# Patient Record
Sex: Female | Born: 1975 | Race: Black or African American | Hispanic: No | Marital: Single | Smoking: Never smoker
Health system: Southern US, Community
[De-identification: ages and names within clinical notes are randomized; demographics above are authoritative.]

## PROBLEM LIST (undated history)

## (undated) DIAGNOSIS — J45909 Unspecified asthma, uncomplicated: Secondary | ICD-10-CM

---

## 2018-04-08 ENCOUNTER — Encounter (HOSPITAL_BASED_OUTPATIENT_CLINIC_OR_DEPARTMENT_OTHER): Payer: Self-pay | Admitting: Emergency Medicine

## 2018-04-08 ENCOUNTER — Emergency Department (HOSPITAL_BASED_OUTPATIENT_CLINIC_OR_DEPARTMENT_OTHER)
Admission: EM | Admit: 2018-04-08 | Discharge: 2018-04-08 | Disposition: A | Discharge status: Home / Self Care | Payer: Self-pay | Attending: Emergency Medicine | Admitting: Emergency Medicine

## 2018-04-08 ENCOUNTER — Other Ambulatory Visit: Payer: Self-pay

## 2018-04-08 DIAGNOSIS — R0789 Other chest pain: Secondary | ICD-10-CM | POA: Insufficient documentation

## 2018-04-08 DIAGNOSIS — J45909 Unspecified asthma, uncomplicated: Secondary | ICD-10-CM | POA: Insufficient documentation

## 2018-04-08 DIAGNOSIS — Z79899 Other long term (current) drug therapy: Secondary | ICD-10-CM | POA: Insufficient documentation

## 2018-04-08 HISTORY — DX: Unspecified asthma, uncomplicated: J45.909

## 2018-04-08 MED ORDER — SULFAMETHOXAZOLE-TRIMETHOPRIM 800-160 MG PO TABS: 1.0000 | ORAL_TABLET | Freq: Two times a day (BID) | ORAL | 0 refills | Status: AC

## 2018-04-08 NOTE — ED Provider Notes (Signed)
[facility] EMERGENCY DEPARTMENT Provider Note   CSN: [id number] Arrival date & time: 04/08/18  0920     History   Chief Complaint Chief Complaint  Patient presents with  . Abscess    HPI Denise Dennis is a 43 y.o. female.  HPI   43 year old female with chest pain.  She states that she noticed "a knot" in the center of her chest yesterday.  This been painful since she noticed it.  Hurts to the touch and also had increased pain when she is lying her back trying to sleep.  No overlying skin changes.  No fevers or chills. No drainage.  No cough or shortness of breath.  Denies any trauma to the area.  Past Medical History:  Diagnosis Date  . Asthma     There are no active problems to display for this patient.   History reviewed. No pertinent surgical history.   OB History   No obstetric history on file.      Home Medications    Prior to Admission medications   Medication Sig Start Date End Date Taking? Authorizing Provider  albuterol (PROVENTIL HFA;VENTOLIN HFA) 108 (90 Base) MCG/ACT inhaler Inhale 2 puffs into the lungs every 6 (six) hours as needed for wheezing or shortness of breath.   Yes [provider]    Family History History reviewed. No pertinent family history.  Social History Social History   Tobacco Use  . Smoking status: Never Smoker  . Smokeless tobacco: Never Used  Substance Use Topics  . Alcohol use: Never    Frequency: Never  . Drug use: Never     Allergies   Patient has no known allergies.   Review of Systems Review of Systems  All systems reviewed and negative, other than as noted in HPI.  Physical Exam Updated Vital Signs Pulse (!) 51   Resp 18   Ht 5\' 4"  (1.626 m)   Wt 93 kg   LMP 03/31/2018   SpO2 100%   BMI 35.19 kg/m   Physical Exam Vitals signs and nursing note reviewed.  Constitutional:      General: She is not in acute distress.    Appearance: She is well-developed.  HENT:     Head:  Normocephalic and atraumatic.  Eyes:     General:        Right eye: No discharge.        Left eye: No discharge.     Conjunctiva/sclera: Conjunctivae normal.  Neck:     Musculoskeletal: Neck supple.  Cardiovascular:     Rate and Rhythm: Normal rate and regular rhythm.     Heart sounds: Normal heart sounds. No murmur. No friction rub. No gallop.      Comments: Midline near mid sternum there tender area about the size of a nickel. I question wether I can actually feel a small subcutaneous mass or indurated area. Overlying skin is slightly darker pigment. No erythema. No fluctuance.  Pulmonary:     Effort: Pulmonary effort is normal. No respiratory distress.     Breath sounds: Normal breath sounds.  Chest:    Abdominal:     General: There is no distension.     Palpations: Abdomen is soft.     Tenderness: There is no abdominal tenderness.  Musculoskeletal:        General: No tenderness.  Skin:    General: Skin is warm and dry.  Neurological:     Mental Status: She is alert.  Psychiatric:  Behavior: Behavior normal.        Thought Content: Thought content normal.      ED Treatments / Results  Labs (all labs ordered are listed, but only abnormal results are displayed) Labs Reviewed - No data to display  EKG EKG Interpretation  Date/Time:  Sunday April 08 2018 09:28:58 EST Ventricular Rate:  95 PR Interval:  116 QRS Duration: 84 QT Interval:  374 QTC Calculation: 469 R Axis:   82 Text Interpretation:  Sinus rhythm with frequent Premature ventricular complexes in a pattern of bigeminy Otherwise normal ECG Confirmed by Raeford RazorKohut, Lilyana Lippman [phone]([id number]) on 04/08/2018 9:35:18 AM   Radiology No results found.  Procedures Procedures (including critical care time)  Medications Ordered in ED Medications - No data to display   Initial Impression / Assessment and Plan / ED Course  I have reviewed the triage vital signs and the nursing notes.  Pertinent labs & imaging  results that were available during my care of the patient were reviewed by me and considered in my medical decision making (see chart for details).    42yF with painful spot on her chest wall. Developing a small abscess?  She says she feels a "knot." Her exam is not very impressive. Maybe I can feel a small nodule? Will treat as possible abscess. Nothing I would attempt to I&D at this point. This seems distinct from sternum and also breast tissue but I advised her that it would be prudent to also obtain mammography.   Final Clinical Impressions(s) / ED Diagnoses   Final diagnoses:  Anterior chest wall pain    ED Discharge Orders    None       Raeford RazorKohut, Cayle Thunder, MD 04/08/18 [phone]

## 2018-04-08 NOTE — Discharge Instructions (Addendum)
Take antibiotics as prescribed. Take 600 mg of ibuprofen every 6 hours as needed for pain. I think you may be starting to develop an abscess/boil but I would also obtain a mammogram to further assess.

## 2018-04-08 NOTE — ED Triage Notes (Signed)
Patient states that she has a pimple on her chest that is causing her pain  - the patient reports that she noticed that bump yesterday

## 2018-07-28 ENCOUNTER — Encounter (HOSPITAL_BASED_OUTPATIENT_CLINIC_OR_DEPARTMENT_OTHER): Payer: Self-pay | Admitting: Emergency Medicine

## 2018-07-28 ENCOUNTER — Emergency Department (HOSPITAL_BASED_OUTPATIENT_CLINIC_OR_DEPARTMENT_OTHER): Payer: Self-pay

## 2018-07-28 ENCOUNTER — Emergency Department (HOSPITAL_BASED_OUTPATIENT_CLINIC_OR_DEPARTMENT_OTHER)
Admission: EM | Admit: 2018-07-28 | Discharge: 2018-07-28 | Disposition: A | Discharge status: Home / Self Care | Payer: Self-pay | Attending: Emergency Medicine | Admitting: Emergency Medicine

## 2018-07-28 ENCOUNTER — Other Ambulatory Visit: Payer: Self-pay

## 2018-07-28 DIAGNOSIS — R079 Chest pain, unspecified: Secondary | ICD-10-CM | POA: Insufficient documentation

## 2018-07-28 DIAGNOSIS — M436 Torticollis: Secondary | ICD-10-CM | POA: Insufficient documentation

## 2018-07-28 DIAGNOSIS — J45909 Unspecified asthma, uncomplicated: Secondary | ICD-10-CM | POA: Insufficient documentation

## 2018-07-28 DIAGNOSIS — M7918 Myalgia, other site: Secondary | ICD-10-CM | POA: Insufficient documentation

## 2018-07-28 DIAGNOSIS — R0789 Other chest pain: Secondary | ICD-10-CM

## 2018-07-28 LAB — BASIC METABOLIC PANEL
Anion gap: 8 (ref 5–15)
BUN: 13 mg/dL (ref 6–20)
CO2: 21 mmol/L — ABNORMAL LOW (ref 22–32)
Calcium: 8.8 mg/dL — ABNORMAL LOW (ref 8.9–10.3)
Chloride: 109 mmol/L (ref 98–111)
Creatinine, Ser: 0.8 mg/dL (ref 0.44–1.00)
GFR calc Af Amer: >60 mL/min (ref >60)
GFR calc non Af Amer: >60 mL/min (ref >60)
Glucose, Bld: 95 mg/dL (ref 70–99)
Potassium: 3.7 mmol/L (ref 3.5–5.1)
Sodium: 138 mmol/L (ref 135–145)

## 2018-07-28 LAB — TROPONIN I
Troponin I: <0.03 ng/mL (ref <0.03)
Troponin I: <0.03 ng/mL (ref <0.03)

## 2018-07-28 LAB — CBC
HCT: 36 % (ref 36.0–46.0)
Hemoglobin: 10.8 g/dL — ABNORMAL LOW (ref 12.0–15.0)
MCH: 24.5 pg — ABNORMAL LOW (ref 26.0–34.0)
MCHC: 30 g/dL (ref 30.0–36.0)
MCV: 81.6 fL (ref 80.0–100.0)
Platelets: 226 10*3/uL (ref 150–400)
RBC: 4.41 MIL/uL (ref 3.87–5.11)
RDW: 16.5 % — ABNORMAL HIGH (ref 11.5–15.5)
WBC: 9.9 10*3/uL (ref 4.0–10.5)
nRBC: 0 % (ref 0.0–0.2)

## 2018-07-28 LAB — PREGNANCY, URINE: Preg Test, Ur: NEGATIVE

## 2018-07-28 MED ORDER — METHOCARBAMOL 500 MG PO TABS: 500.0000 mg | ORAL_TABLET | Freq: Two times a day (BID) | ORAL | 0 refills | Status: DC

## 2018-07-28 MED ORDER — KETOROLAC TROMETHAMINE 30 MG/ML IJ SOLN
30.0000 mg | Freq: Once | INTRAMUSCULAR | Status: AC
  Administered 2018-07-28: 30 mg via INTRAVENOUS
  Filled 2018-07-28: qty 1

## 2018-07-28 NOTE — ED Provider Notes (Signed)
[facility] EMERGENCY DEPARTMENT Provider Note   CSN: [id number] Arrival date & time: 07/28/18  1631    History   Chief Complaint Chief Complaint  Patient presents with   Chest Pain    HPI Denise Dennis is a 43 y.o. female with PMH/o asthma who presents for evaluation of 2 days of pain noted to her left neck, shoulder and chest.  Patient reports that initially pain began 2 days ago when she woke up in the morning.  She states initially, it was towards the trapezius area and was extending up towards the paraspinal muscles of her left cervical region.  Patient states she is able to tolerate her secretions and p.o. without any difficulty.  She does report that it feels sore. She states the pain is worse with movement. Patient reports that over the course today, it started spreading towards the neck into the anterior aspect of the chest and the shoulder.  She describes it as a "constant soreness" that has been ongoing for the last 2 days.  Patient reports she took Tylenol PM to sleep which helped her to sleep but states she continued to have pain.  Patient states that she does not have any associated nausea, diaphoresis.  Patient states that the pain is not worse with deep inspiration or exertion.  Patient states that the pain is worse when she lays down and tries to lay on her left side.  She states it is slightly better if she sits up.  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  Patient states she lifted a patient a few days prior to onset of symptoms.  But denies any other trauma, injury, fall.  Patient states that she has not had any preceding fever, cough, congestion.  She states she has not had any associated shortness of breath.  Patient states that she does not smoke and denies any cocaine or IV drug use.  She denies any personal cardiac history.  She denies any family heart attacks over the age of 66.  Patient with no history of high  blood pressure or diabetes.  Patient denies any fevers, vision changes, dizziness, numbness/weakness of her arms or legs, difficulty ambulating. She denies any OCP use, recent immobilization, prior history of DVT/PE, recent surgery, leg swelling, or long travel. Denies fevers, weight loss, numbness/weakness of upper and lower extremities, bowel/bladder incontinence, saddle anesthesia, history of back surgery, history of IVDA.      The history is provided by the patient.    Past Medical History:  Diagnosis Date   Asthma     There are no active problems to display for this patient.   History reviewed. No pertinent surgical history.   OB History   No obstetric history on file.      Home Medications    Prior to Admission medications   Medication Sig Start Date End Date Taking? Authorizing Provider  albuterol (PROVENTIL HFA;VENTOLIN HFA) 108 (90 Base) MCG/ACT inhaler Inhale 2 puffs into the lungs every 6 (six) hours as needed for wheezing or shortness of breath.    [provider]  methocarbamol (ROBAXIN) 500 MG tablet Take 1 tablet (500 mg total) by mouth 2 (two) times daily. 07/28/18   Maxwell Caul, PA-C    Family History No family history on file.  Social History Social History   Tobacco Use   Smoking status: Never Smoker   Smokeless tobacco: Never Used  Substance Use Topics  Alcohol use: Never    Frequency: Never   Drug use: Never     Allergies   Patient has no known allergies.   Review of Systems Review of Systems  Constitutional: Negative for fever.  Eyes: Negative for visual disturbance.  Respiratory: Negative for cough and shortness of breath.   Cardiovascular: Positive for chest pain.  Gastrointestinal: Negative for abdominal pain, nausea and vomiting.  Genitourinary: Negative for dysuria and hematuria.  Musculoskeletal: Positive for neck pain.       Shoulder pain  Neurological: Negative for dizziness, weakness, numbness and  headaches.  All other systems reviewed and are negative.    Physical Exam Updated Vital Signs BP (!) 145/93    Pulse 95    Temp 98.2 F (36.8 C) (Oral)    Resp (!) 21    Ht 5\' 4"  (1.626 m)    Wt 94.3 kg    LMP 07/25/2018    SpO2 100%    BMI 35.70 kg/m   Physical Exam Vitals signs and nursing note reviewed.  Constitutional:      Appearance: Normal appearance. She is well-developed.  HENT:     Head: Normocephalic and atraumatic.  Eyes:     General: Lids are normal.     Conjunctiva/sclera: Conjunctivae normal.     Pupils: Pupils are equal, round, and reactive to light.     Comments: PERRL. EOMs intact.   Neck:     Musculoskeletal: Normal range of motion and neck supple.     Thyroid: No thyroid mass or thyromegaly.     Vascular: No carotid bruit.      Comments: Diffuse tenderness palpation noted to the paraspinal muscles of the left cervical region that extends into the musculoskeletal of the left shoulder.  Flexion/tension intact without any difficulty.  She does report some pain with flexion.  She reports some soreness with lateral movement but is intact with any difficulty.  Neck is supple without rigidity.  With palpation of thyroid.  No evidence of thyromegaly, mass. Cardiovascular:     Rate and Rhythm: Normal rate and regular rhythm.     Pulses: Normal pulses.          Radial pulses are 2+ on the right side and 2+ on the left side.       Dorsalis pedis pulses are 2+ on the right side and 2+ on the left side.     Heart sounds: Normal heart sounds. No murmur. No friction rub. No gallop.   Pulmonary:     Effort: Pulmonary effort is normal.     Breath sounds: Normal breath sounds.  Chest:       Comments: Pain reproduced with palpation of the midsternal chest area.  No deformity or crepitus noted. Abdominal:     Palpations: Abdomen is soft. Abdomen is not rigid.     Tenderness: There is no abdominal tenderness. There is no guarding.  Musculoskeletal: Normal range of motion.    Skin:    General: Skin is warm and dry.     Capillary Refill: Capillary refill takes less than 2 seconds.  Neurological:     Mental Status: She is alert and oriented to person, place, and time.     Comments: Cranial nerves III-XII intact Follows commands, Moves all extremities  5/5 strength to BUE and BLE  Sensation intact throughout all major nerve distributions Normal coordination No pronator drift. No gait abnormalities  No slurred speech. No facial droop.   Psychiatric:  Speech: Speech normal.      ED Treatments / Results  Labs (all labs ordered are listed, but only abnormal results are displayed) Labs Reviewed  BASIC METABOLIC PANEL - Abnormal; Notable for the following components:      Result Value   CO2 21 (*)    Calcium 8.8 (*)    All other components within normal limits  CBC - Abnormal; Notable for the following components:   Hemoglobin 10.8 (*)    MCH 24.5 (*)    RDW 16.5 (*)    All other components within normal limits  TROPONIN I  PREGNANCY, URINE  TROPONIN I    EKG EKG Interpretation  Date/Time:  Saturday Jul 28 2018 16:37:04 EDT Ventricular Rate:  94 PR Interval:  126 QRS Duration: 84 QT Interval:  372 QTC Calculation: 465 R Axis:   77 Text Interpretation:  Sinus rhythm with Premature supraventricular complexes and with frequent Premature ventricular complexes Otherwise normal ECG No significant change since last tracing Confirmed by Tilden Fossa [phone]) on 07/28/2018 4:40:00 PM   Radiology Dg Chest 2 View  Result Date: 07/28/2018 CLINICAL DATA:  Pain between shoulder blades.  Palpitations. EXAM: CHEST - 2 VIEW COMPARISON:  None. FINDINGS: The heart size and mediastinal contours are within normal limits. Both lungs are clear. The visualized skeletal structures are unremarkable. IMPRESSION: No active cardiopulmonary disease. Electronically Signed   By: Gerome Sam III M.D   On: 07/28/2018 17:14    Procedures Procedures (including  critical care time)  Medications Ordered in ED Medications  ketorolac (TORADOL) 30 MG/ML injection 30 mg (30 mg Intravenous Given 07/28/18 2049)     Initial Impression / Assessment and Plan / ED Course  I have reviewed the triage vital signs and the nursing notes.  Pertinent labs & imaging results that were available during my care of the patient were reviewed by me and considered in my medical decision making (see chart for details).        43 year old female who presents for evaluation of pain to neck, shoulder, chest x2 days.  Reports it started when she woke up 2 days ago.  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  No associated numbness/weakness, difficulty breathing, fevers, vision changes, dizziness.  She describes it as a soreness.  It is not worse with deep inspiration or exertion. Patient is afebrile, non-toxic appearing, sitting comfortably on examination table. Vital signs reviewed and stable. No neuro deficits noted on exam.  On exam, she has tenderness palpation noted of the trapezius muscle and the paraspinal muscles of the right cervical region.  Suspect that this may be torticollis, particular given that her pain is worse with movement.  Low suspicion for ACS etiology as it sounds atypical.  Additionally, history/physical exam is not concerning for PE, cauda equina, spinal abscess, carotid artery dissection, aortic dissection, CVA.  Initial labs ordered at triage.  Troponin negative.  CBC without any significant leukocytosis.  Hemoglobin is 10.8.  BMP is unremarkable.  Chest x-ray negative for any acute infectious etiology.  EKG reassuring.  Delta troponin negative.  Vitals stable.  Discussed results with patient.  Patient reports some improvement in pain with analgesics here in the department.  At this time, I suspect that her symptoms most likely related to torticollis with musculoskeletal pain.  Encourage at home supportive care  measures.  We will plan to give short course of muscle relaxers to help with pain. At this time, patient  exhibits no emergent life-threatening condition that require further evaluation in ED or admission. Patient had ample opportunity for questions and discussion. All patient's questions were answered with full understanding. Strict return precautions discussed. Patient expresses understanding and agreement to plan.   Portions of this note were generated with Scientist, clinical (histocompatibility and immunogenetics). Dictation errors may occur despite best attempts at proofreading.   Final Clinical Impressions(s) / ED Diagnoses   Final diagnoses:  Torticollis  Musculoskeletal pain  Atypical chest pain    ED Discharge Orders         Ordered    methocarbamol (ROBAXIN) 500 MG tablet  2 times daily     07/28/18 2146           Rosana Hoes 07/28/18 2315    Tilden Fossa, MD 07/31/18 0700

## 2018-07-28 NOTE — Discharge Instructions (Signed)
You can take Tylenol or Ibuprofen as directed for pain. You can alternate Tylenol and Ibuprofen every 4 hours. If you take Tylenol at 1pm, then you can take Ibuprofen at 5pm. Then you can take Tylenol again at 9pm.   Take Robaxin as prescribed. This medication will make you drowsy so do not drive or drink alcohol when taking it.  You can apply heat to help with pain.  Return to the emergency department for worsening pain, fever, numbness/weakness of your arms or legs, worsening chest pain, difficulty breathing or any other worsening or concerning symptoms.

## 2018-07-28 NOTE — ED Triage Notes (Signed)
L side chest pain radiating into jaw and L shoulder x 2 days.

## 2020-06-16 ENCOUNTER — Telehealth: Payer: Self-pay

## 2020-06-16 NOTE — Telephone Encounter (Signed)
Appointment for 06/18/2020 has been cancelled.  Dr. Sedalia Muta had an emergency and will be out of the office for a few weeks or so. Dr. Sedalia Muta stated that can reschedule the appointment with a different provider ONLY if the pt does not have Medicare ins. If this patient has Medicare she will need to see Dr. Marina Goodell.  I left a message on the number listed in the cart asking the patient to call back to rescheduled the appointment.

## 2020-06-18 ENCOUNTER — Ambulatory Visit: Payer: Self-pay | Admitting: Family Medicine

## 2020-10-05 IMAGING — DX CHEST - 2 VIEW
2 series · 2 of 2 positions shown · non-contrast
Comparison: None.

CLINICAL DATA: Pain between shoulder blades.  Palpitations.

EXAM:
CHEST - 2 VIEW

[chest pa]
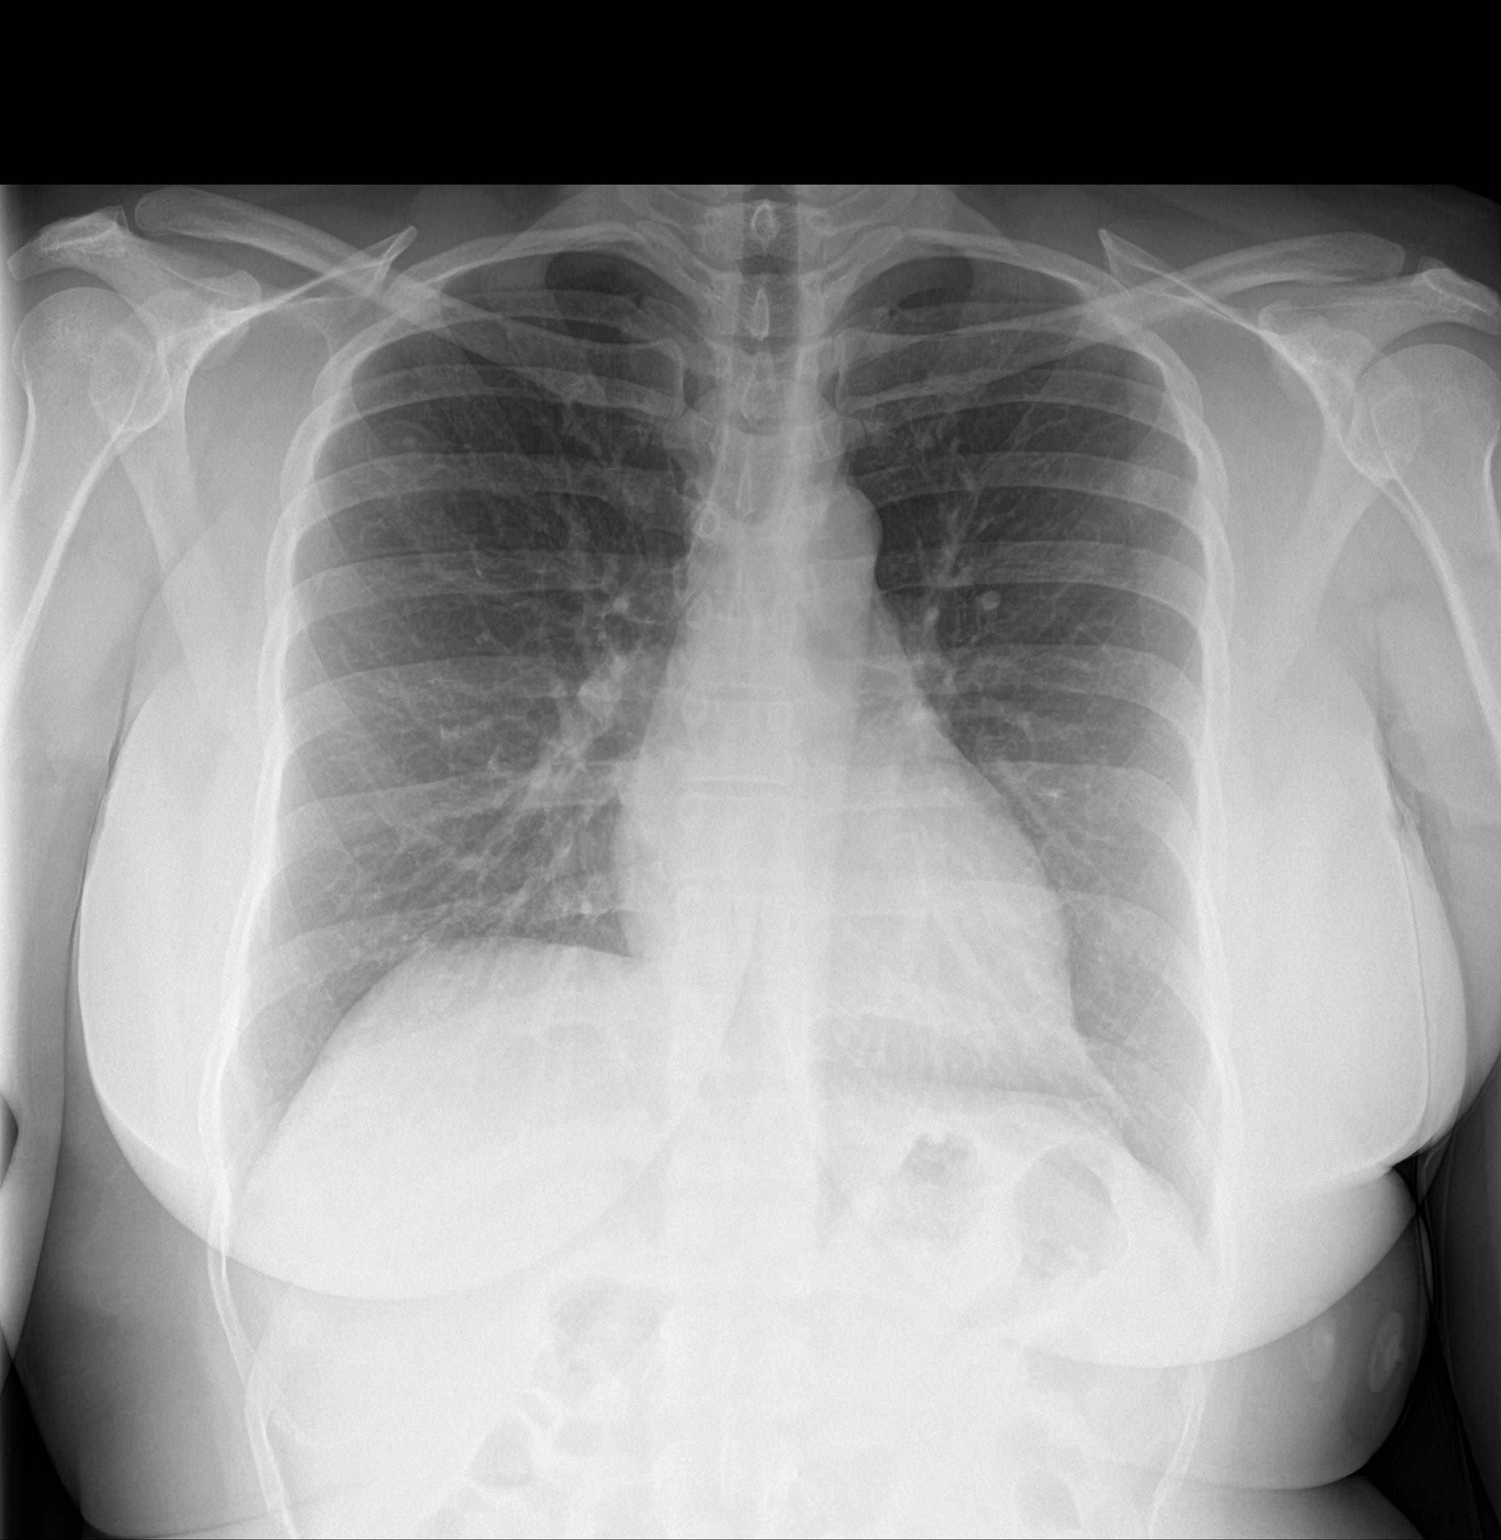

[chest lat]
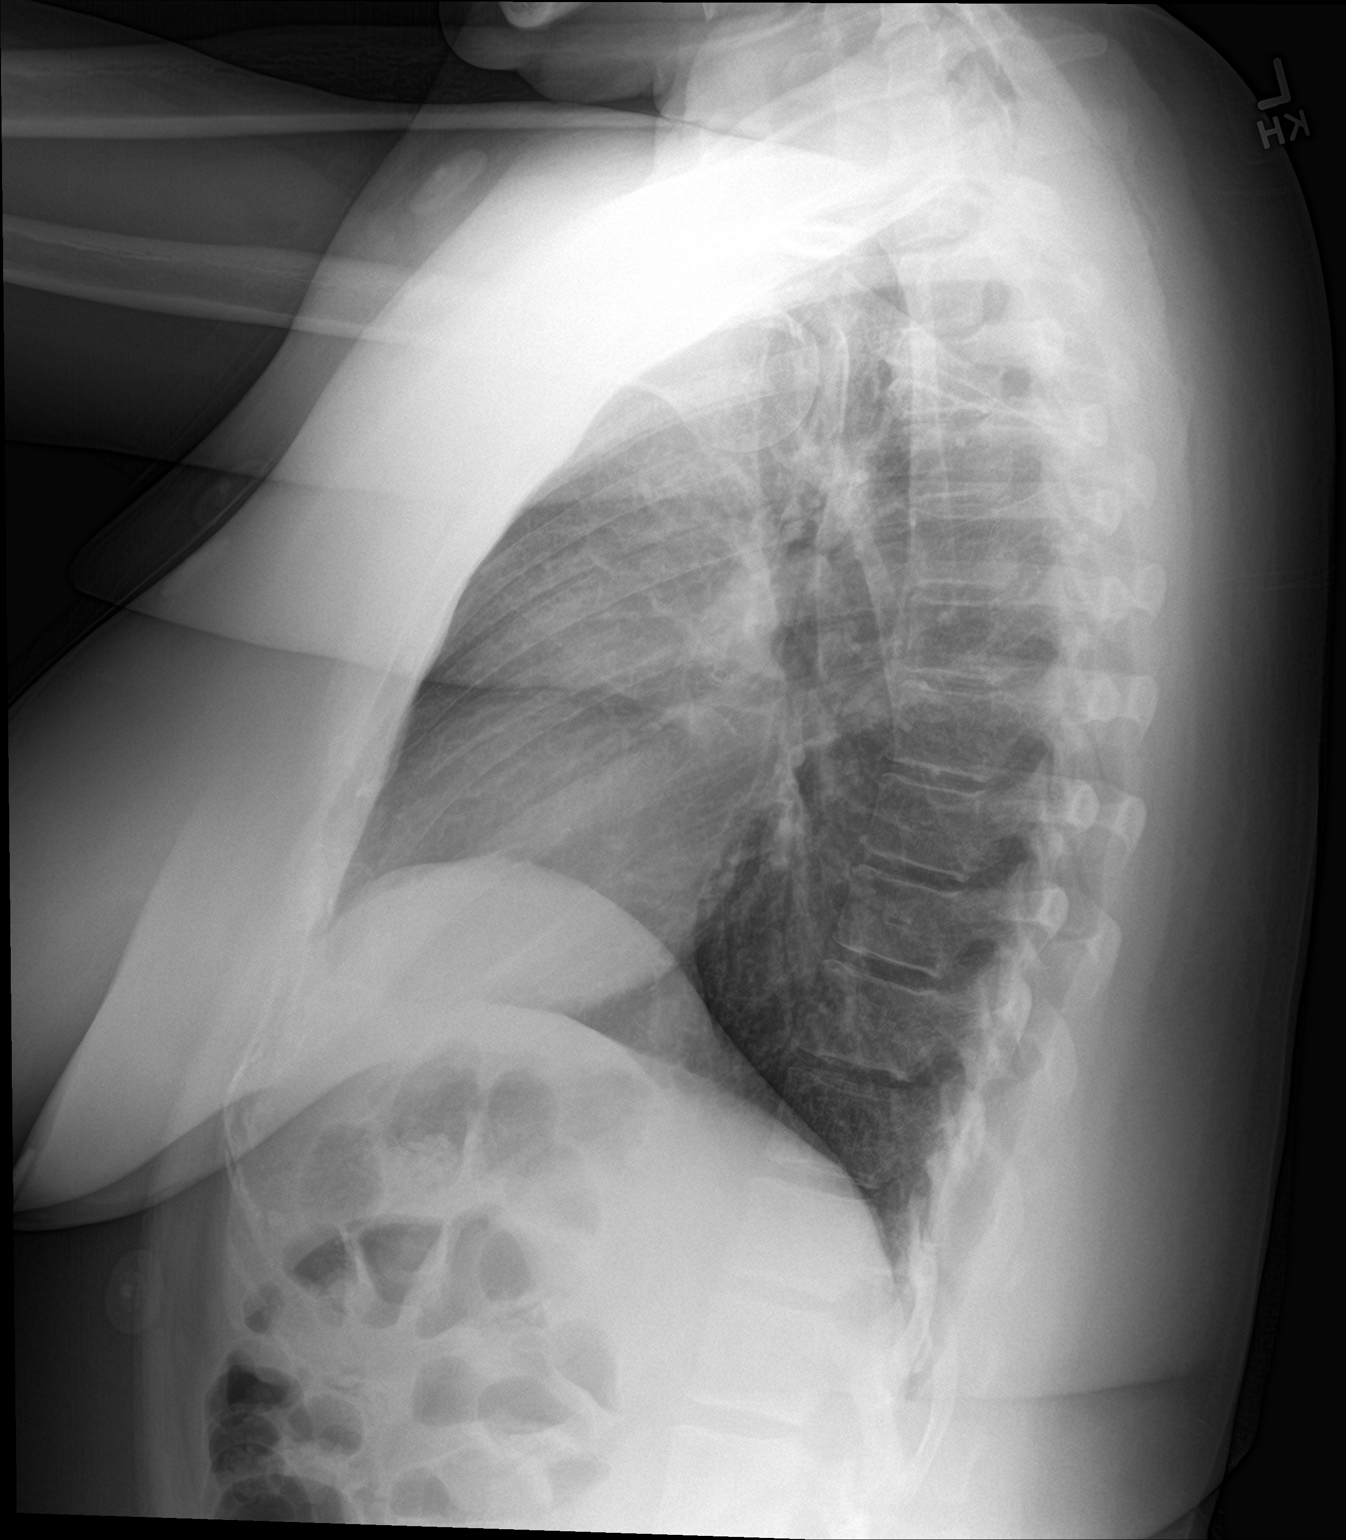

[2 of 2 positions shown; findings below may reference images not displayed]

FINDINGS: The heart size and mediastinal contours are within normal limits.
Both lungs are clear. The visualized skeletal structures are
unremarkable.
IMPRESSION: No active cardiopulmonary disease.

## 2021-04-07 ENCOUNTER — Emergency Department (HOSPITAL_BASED_OUTPATIENT_CLINIC_OR_DEPARTMENT_OTHER)
Admission: EM | Admit: 2021-04-07 | Discharge: 2021-04-07 | Disposition: A | Discharge status: Home / Self Care | Payer: Commercial Managed Care - HMO | Attending: Emergency Medicine | Admitting: Emergency Medicine

## 2021-04-07 ENCOUNTER — Encounter (HOSPITAL_BASED_OUTPATIENT_CLINIC_OR_DEPARTMENT_OTHER): Payer: Self-pay | Admitting: Obstetrics and Gynecology

## 2021-04-07 ENCOUNTER — Other Ambulatory Visit: Payer: Self-pay

## 2021-04-07 DIAGNOSIS — I10 Essential (primary) hypertension: Secondary | ICD-10-CM | POA: Insufficient documentation

## 2021-04-07 DIAGNOSIS — R519 Headache, unspecified: Secondary | ICD-10-CM | POA: Diagnosis present

## 2021-04-07 DIAGNOSIS — I498 Other specified cardiac arrhythmias: Secondary | ICD-10-CM

## 2021-04-07 DIAGNOSIS — R008 Other abnormalities of heart beat: Secondary | ICD-10-CM | POA: Diagnosis not present

## 2021-04-07 DIAGNOSIS — Z7951 Long term (current) use of inhaled steroids: Secondary | ICD-10-CM | POA: Insufficient documentation

## 2021-04-07 DIAGNOSIS — J45909 Unspecified asthma, uncomplicated: Secondary | ICD-10-CM | POA: Insufficient documentation

## 2021-04-07 DIAGNOSIS — R030 Elevated blood-pressure reading, without diagnosis of hypertension: Secondary | ICD-10-CM

## 2021-04-07 LAB — CBC WITH DIFFERENTIAL/PLATELET
Abs Immature Granulocytes: 0.01 10*3/uL (ref 0.00–0.07)
Basophils Absolute: 0.1 10*3/uL (ref 0.0–0.1)
Basophils Relative: 1 %
Eosinophils Absolute: 0.6 10*3/uL — ABNORMAL HIGH (ref 0.0–0.5)
Eosinophils Relative: 7 %
HCT: 37 % (ref 36.0–46.0)
Hemoglobin: 11.4 g/dL — ABNORMAL LOW (ref 12.0–15.0)
Immature Granulocytes: 0 %
Lymphocytes Relative: 33 %
Lymphs Abs: 2.5 10*3/uL (ref 0.7–4.0)
MCH: 24.7 pg — ABNORMAL LOW (ref 26.0–34.0)
MCHC: 30.8 g/dL (ref 30.0–36.0)
MCV: 80.3 fL (ref 80.0–100.0)
Monocytes Absolute: 0.5 10*3/uL (ref 0.1–1.0)
Monocytes Relative: 6 %
Neutro Abs: 4.2 10*3/uL (ref 1.7–7.7)
Neutrophils Relative %: 53 %
Platelets: 262 10*3/uL (ref 150–400)
RBC: 4.61 MIL/uL (ref 3.87–5.11)
RDW: 15.9 % — ABNORMAL HIGH (ref 11.5–15.5)
WBC: 7.8 10*3/uL (ref 4.0–10.5)
nRBC: 0 % (ref 0.0–0.2)

## 2021-04-07 LAB — COMPREHENSIVE METABOLIC PANEL
ALT: 10 U/L (ref 0–44)
AST: 15 U/L (ref 15–41)
Albumin: 4.1 g/dL (ref 3.5–5.0)
Alkaline Phosphatase: 54 U/L (ref 38–126)
Anion gap: 8 (ref 5–15)
BUN: 11 mg/dL (ref 6–20)
CO2: 25 mmol/L (ref 22–32)
Calcium: 9.2 mg/dL (ref 8.9–10.3)
Chloride: 106 mmol/L (ref 98–111)
Creatinine, Ser: 0.85 mg/dL (ref 0.44–1.00)
GFR, Estimated: >60 mL/min (ref >60)
Glucose, Bld: 99 mg/dL (ref 70–99)
Potassium: 4.1 mmol/L (ref 3.5–5.1)
Sodium: 139 mmol/L (ref 135–145)
Total Bilirubin: 0.4 mg/dL (ref 0.3–1.2)
Total Protein: 7.3 g/dL (ref 6.5–8.1)

## 2021-04-07 LAB — TSH: TSH: 1.034 u[IU]/mL (ref 0.350–4.500)

## 2021-04-07 LAB — MAGNESIUM: Magnesium: 1.8 mg/dL (ref 1.7–2.4)

## 2021-04-07 NOTE — ED Triage Notes (Signed)
Patient reports she has no formal hx of hypertension but the last few days it has been 150's/90's and it is causing her to have headaches. Patient reports she is not having any chest pains. She reports she thought she was having sinus headaches and then was advised by family to check her BP. Patient reports she is attempted to establish care with [facility] physicians for PCP.

## 2021-04-07 NOTE — Discharge Instructions (Addendum)
I attached a health clinic for you to follow-up with if you are unable to get in with the primary care provider you are trying to establish with.  It is very important that you also follow-up with a cardiologist attached to these papers.  I attached to for you to try.  If they do not accept your insurance, please call offices in the area.  There is a blood pressure log attached to these papers for you to use.  Take your blood pressure in the morning and at night and keep a journal to bring to your primary care provider so you can discuss what your blood pressure looks like at home.

## 2021-04-07 NOTE — ED Notes (Signed)
Pt provided discharge instructions and prescription information. Pt was given the opportunity to ask questions and questions were answered. Discharge signature not obtained in the setting of the COVID-19 pandemic in order to reduce high touch surfaces.  ° °

## 2021-04-07 NOTE — ED Provider Notes (Signed)
[facility] EMERGENCY DEPT Provider Note   CSN: [id number] Arrival date & time: 04/07/21  1121     History  Chief Complaint  Patient presents with   Hypertension   Headache    Denise Dennis is a 46 y.o. female with a past medical history of asthma presenting today due to elevated blood pressure readings.  She has not been diagnosed with hypertension however she has a family history of hypertension and heart disease so she likes to keep track of her blood pressure.  She reports that yesterday and today she was obtaining readings between the 140s and 150s over 90s.  She sometimes has a dull headache.  No primary care provider at this time however she is trying to get fully established with one at this moment.  Denies any chest pain, blurry vision, back pain or any other symptoms.  Reports she is just worried due to her family history.  Also endorses being under extra stress lately.  -High readings today and yesterday -stress -blurry vision without glasses   Hypertension Associated symptoms include headaches.  Headache     Home Medications Prior to Admission medications   Medication Sig Start Date End Date Taking? Authorizing Provider  albuterol (PROVENTIL HFA;VENTOLIN HFA) 108 (90 Base) MCG/ACT inhaler Inhale 2 puffs into the lungs every 6 (six) hours as needed for wheezing or shortness of breath.   Yes [provider]  methocarbamol (ROBAXIN) 500 MG tablet Take 1 tablet (500 mg total) by mouth 2 (two) times daily. Patient not taking: Reported on 04/07/2021 07/28/18   Maxwell Caul, PA-C      Allergies    Patient has no known allergies.    Review of Systems   Review of Systems  Neurological:  Positive for headaches.  Per HPI  Physical Exam Updated Vital Signs BP 120/88    Pulse 94    Temp 98.4 F (36.9 C)    Resp 16    Ht 5\' 4"  (1.626 m)    Wt 93.4 kg    LMP 03/26/2021 (Exact Date)    SpO2 100%    BMI 35.36 kg/m  Physical Exam Vitals and  nursing note reviewed.  Constitutional:      General: She is not in acute distress.    Appearance: Normal appearance. She is not ill-appearing.  HENT:     Head: Normocephalic and atraumatic.  Eyes:     General: No scleral icterus.    Conjunctiva/sclera: Conjunctivae normal.     Pupils: Pupils are equal, round, and reactive to light.  Cardiovascular:     Rate and Rhythm: Normal rate.     Comments: Audible premature beats Pulmonary:     Effort: Pulmonary effort is normal. No respiratory distress.     Breath sounds: No wheezing or rales.  Skin:    General: Skin is warm and dry.     Findings: No rash.  Neurological:     Mental Status: She is alert.     Gait: Gait normal.  Psychiatric:        Mood and Affect: Mood normal.        Behavior: Behavior normal.    ED Results / Procedures / Treatments   Labs (all labs ordered are listed, but only abnormal results are displayed) Labs Reviewed  CBC WITH DIFFERENTIAL/PLATELET - Abnormal; Notable for the following components:      Result Value   Hemoglobin 11.4 (*)    MCH 24.7 (*)    RDW 15.9 (*)  Eosinophils Absolute 0.6 (*)    All other components within normal limits  COMPREHENSIVE METABOLIC PANEL    EKG None  Radiology No results found.  Procedures Procedures    Medications Ordered in ED Medications - No data to display  ED Course/ Medical Decision Making/ A&P                           Medical Decision Making Amount and/or Complexity of Data Reviewed Labs: ordered.   46 year old female presenting due to elevated blood pressure readings.  She reported occasional headaches however she is largely nonsymptomatic of these elevated pressures.   Co morbidities that complicate the patient evaluation  Asthma  Additional history obtained:  Additional history obtained from in system chart review from 2021 patient presented for chest wall pain.  Lab Tests:  I Ordered, and personally interpreted labs.  There are no  remarkable abnormalities   Cardiac Monitoring:  The patient was maintained on a cardiac monitor.  I personally viewed and interpreted the cardiac monitored which showed an underlying rhythm of: Ventricular bigeminy.    This is audible on auscultation as well.  Per chart review, this was present in 2020.  [place] and has not followed up.  Test Considered:  Troponin, patient does not endorse chest pain and blood pressure under control in the department  -CT head: Patient's neurologically intact and does not have a current headache.  Blood pressure not severe enough for a hypertensive emergency  Problem List / ED Course:  An EKG   Social Determinants of Health:  Difficulty getting established with a PCP  Dispostion:  After consideration of the diagnostic results and the patients response to treatment, I feel that the patent would benefit from prompt follow-up with cardiology about her bigeminy.  She also needs a primary care provider to follow her elevated blood pressures and decide whether or not she needs to be diagnosed and treated for hypertension.  Referrals placed.  She has also been discharged with return precautions and information about hypertension as well as a blood pressure log.  Final Clinical Impression(s) / ED Diagnoses Final diagnoses:  Ventricular bigeminy  Elevated blood pressure reading    Rx / DC Orders Results and diagnoses were explained to the patient. Return precautions discussed in full. Patient had no additional questions and expressed complete understanding.   This chart was dictated using voice recognition software.  Despite best efforts to proofread,  errors can occur which can change the documentation meaning.      Woodroe Chen 04/07/21 1402    Tegeler, Canary Brim, MD 04/07/21 [phone]

## 2021-04-19 ENCOUNTER — Ambulatory Visit: Payer: Managed Care, Other (non HMO) | Admitting: Cardiology

## 2021-05-03 ENCOUNTER — Inpatient Hospital Stay: Payer: Managed Care, Other (non HMO)

## 2021-05-03 ENCOUNTER — Encounter: Payer: Self-pay | Admitting: Cardiology

## 2021-05-03 ENCOUNTER — Other Ambulatory Visit: Payer: Self-pay

## 2021-05-03 ENCOUNTER — Ambulatory Visit: Payer: Managed Care, Other (non HMO) | Admitting: Cardiology

## 2021-05-03 VITALS — BP 122/87 | HR 82 | Temp 98.0°F | Resp 16 | Ht 64.0 in | Wt 208.4 lb

## 2021-05-03 DIAGNOSIS — R002 Palpitations: Secondary | ICD-10-CM

## 2021-05-03 DIAGNOSIS — I498 Other specified cardiac arrhythmias: Secondary | ICD-10-CM

## 2021-05-03 NOTE — Progress Notes (Signed)
Date:  05/03/2021   ID:  Denise Dennis, DOB Jul 30, 1975, MRN [id number]  PCP:  Patient, No Pcp Per (Inactive)  Cardiologist:  Rex Kras, DO, [facility] (established care 05/03/2021)  REASON FOR CONSULT: Ventricular bigeminy  REQUESTING PHYSICIAN:  No referring provider defined for this encounter.  Chief Complaint  Patient presents with   Hospitalization Follow-up   New Patient (Initial Visit)   Ventricular bigeminy    HPI  Denise Dennis is a 46 y.o. Hispanic female who presents to the office with a chief complaint of " recent ED visit/palpitations." Patient's past medical history and cardiovascular risk factors include: Asthma, family history of heart disease, Obesity due to excess calories.   Patient is referred to the practice by [facility] emergency room department for evaluation of ventricular bigeminy and palpitations.  Patient went to the ED in January 2023 due to elevated blood pressures and on telemetry was noted to have ventricular bigeminy/PVCs.  She is referred to cardiology for further evaluation and management.  Review of systems are also positive for palpitations which occur at least 3 times a week, present for the last couple years, last for a few minutes, self-limited.  No associated chest pain/shortness of breath/near syncope/syncope/lightheaded/dizziness.  No significant intake of coffee/caffeinated beverages, alcohol, illicit drugs, stimulant medications, weight loss supplements, no new over-the-counter medications.  No prior history of CAD/MI/PCI/CVA/TIA/DVT/PE/CHF.  No family history of sudden cardiac death, premature CAD, or cardiomyopathy.  FUNCTIONAL STATUS: No structured exercise program or daily routine.   ALLERGIES: No Known Allergies  MEDICATION LIST PRIOR TO VISIT: Current Meds  Medication Sig   albuterol (PROVENTIL HFA;VENTOLIN HFA) 108 (90 Base) MCG/ACT inhaler Inhale 2 puffs into the lungs every 6 (six) hours as needed for wheezing or  shortness of breath.     PAST MEDICAL HISTORY: Past Medical History:  Diagnosis Date   Asthma     PAST SURGICAL HISTORY: D&C 1995.  FAMILY HISTORY: The patient family history includes Hypertension in her mother; Lung cancer in her father.  SOCIAL HISTORY:  The patient  reports that she has never smoked. She has never used smokeless tobacco. She reports that she does not currently use alcohol. She reports that she does not use drugs.  REVIEW OF SYSTEMS: Review of Systems  Cardiovascular:  Positive for palpitations. Negative for chest pain, dyspnea on exertion, leg swelling, orthopnea, paroxysmal nocturnal dyspnea and syncope.  Respiratory:  Negative for shortness of breath.    PHYSICAL EXAM: Vitals with BMI 05/03/2021 04/07/2021 04/07/2021  Height 5\' 4"  - -  Weight 208 lbs 6 oz - -  BMI [id number] - -  Systolic [id number]  Diastolic 87 97 76  Pulse 82 88 62    CONSTITUTIONAL: Well-developed and well-nourished. No acute distress.  SKIN: Skin is warm and dry. No rash noted. No cyanosis. No pallor. No jaundice HEAD: Normocephalic and atraumatic.  EYES: No scleral icterus MOUTH/THROAT: Moist oral membranes.  NECK: No JVD present. No thyromegaly noted. No carotid bruits  LYMPHATIC: No visible cervical adenopathy.  CHEST Normal respiratory effort. No intercostal retractions  LUNGS: Clear to auscultation bilaterally.  No stridor. No wheezes. No rales.  CARDIOVASCULAR: Regular rate and rhythm, positive S1-S2, no murmurs rubs or gallops appreciated. ABDOMINAL: soft, nontender, nondistended, positive bowel sounds all 4 quadrants. No apparent ascites.  EXTREMITIES: No peripheral edema, warm to touch, +2 bilateral DP and PT pulses HEMATOLOGIC: No significant bruising NEUROLOGIC: Oriented to person, place, and time. Nonfocal. Normal muscle tone.  PSYCHIATRIC: Normal mood and  affect. Normal behavior. Cooperative  CARDIAC DATABASE: EKG: 04/07/2021: Normal sinus rhythm, 83 bpm, normal axis,  PVCs and ventricular bigeminy pattern.  05/03/2021: NSR, 80bpm, normal axis, without underlying injury pattern.  Echocardiogram: No results found for this or any previous visit from the past 1095 days.    Stress Testing: No results found for this or any previous visit from the past 1095 days.   Heart Catheterization: None  LABORATORY DATA: CBC Latest Ref Rng & Units 04/07/2021 07/28/2018  WBC 4.0 - 10.5 K/uL 7.8 9.9  Hemoglobin 12.0 - 15.0 g/dL 11.4(L) 10.8(L)  Hematocrit 36.0 - 46.0 % 37.0 36.0  Platelets 150 - 400 K/uL 262 226    CMP Latest Ref Rng & Units 04/07/2021 07/28/2018  Glucose 70 - 99 mg/dL 99 95  BUN 6 - 20 mg/dL 11 13  Creatinine 0.44 - 1.00 mg/dL 0.85 0.80  Sodium 135 - 145 mmol/L 139 138  Potassium 3.5 - 5.1 mmol/L 4.1 3.7  Chloride 98 - 111 mmol/L 106 109  CO2 22 - 32 mmol/L 25 21(L)  Calcium 8.9 - 10.3 mg/dL 9.2 8.8(L)  Total Protein 6.5 - 8.1 g/dL 7.3 -  Total Bilirubin 0.3 - 1.2 mg/dL 0.4 -  Alkaline Phos 38 - 126 U/L 54 -  AST 15 - 41 U/L 15 -  ALT 0 - 44 U/L 10 -    Lipid Panel  No results found for: CHOL, TRIG, HDL, CHOLHDL, VLDL, LDLCALC, LDLDIRECT, LABVLDL  No components found for: NTPROBNP No results for input(s): PROBNP in the last 8760 hours. Recent Labs    04/07/21 1313  TSH 1.034    BMP Recent Labs    04/07/21 1139  NA 139  K 4.1  CL 106  CO2 25  GLUCOSE 99  BUN 11  CREATININE 0.85  CALCIUM 9.2  GFRNONAA >60    HEMOGLOBIN A1C No results found for: HGBA1C, MPG  IMPRESSION:    ICD-10-CM   1. Ventricular bigeminy  I49.8 EKG 12-Lead    LONG TERM MONITOR (3-14 DAYS)    2. Palpitations  R00.2 LONG TERM MONITOR (3-14 DAYS)       RECOMMENDATIONS: Denise Dennis is a 46 y.o. Hispanic female whose past medical history and cardiac risk factors include: Asthma, family history of heart disease, Obesity due to excess calories.   Ventricular bigeminy / Palpitations: Newly discovered  During her recent ED visit patient  was noted to have frequent PVCs in a bigeminal pattern.  Repeat EKG at today's office visit notes normal sinus rhythm without ectopy.   Patient continues to have intermittent palpitations without symptoms of syncope/near syncope.  No family history of premature CAD/sudden cardiac death/cardiomyopathy.  No episodes of syncope growing up.  No identifiable reversible causes.  Independently reviewed labs.  TSH within acceptable range.  Hemoglobin slightly below normal limits suggestive of normocytic normochromic anemia.  Follow-up with PCP.  We will proceed with a 14-day extended Holter monitor to evaluate for dysrhythmias.  Further recommendations to follow.  Data Reviewed: I have reviewed external notes from the recent ED visit.  I have ordered the following tests: Extended Holter monitor. I have independently reviewed: today's EKG, labs from January 2023.   FINAL MEDICATION LIST END OF ENCOUNTER: No orders of the defined types were placed in this encounter.   Medications Discontinued During This Encounter  Medication Reason   methocarbamol (ROBAXIN) 500 MG tablet      Current Outpatient Medications:    albuterol (PROVENTIL HFA;VENTOLIN HFA) 108 (90 Base) MCG/ACT  inhaler, Inhale 2 puffs into the lungs every 6 (six) hours as needed for wheezing or shortness of breath., Disp: , Rfl:   Orders Placed This Encounter  Procedures   LONG TERM MONITOR (3-14 DAYS)   EKG 12-Lead    There are no Patient Instructions on file for this visit.   --Continue cardiac medications as reconciled in final medication list. --Return in about 6 weeks (around 06/14/2021) for Palpitations, review monitor results. Or sooner if needed. --Continue follow-up with your primary care physician regarding the management of your other chronic comorbid conditions.  Patient's questions and concerns were addressed to her satisfaction. She voices understanding of the instructions provided during this encounter.   This  note was created using a voice recognition software as a result there may be grammatical errors inadvertently enclosed that do not reflect the nature of this encounter. Every attempt is made to correct such errors.  Rex Kras, [place], [facility]  Pager: [phone] Office: [phone]

## 2021-05-28 NOTE — Progress Notes (Signed)
Please contact patient about rescheduling

## 2021-06-02 NOTE — Progress Notes (Signed)
Spoke with patient, appointment needs to be moved to after the 16th, per patient. Ok with Dr. Odis Hollingshead. Patient advised to make sure to call if she has symptoms.

## 2021-06-17 ENCOUNTER — Ambulatory Visit: Payer: Managed Care, Other (non HMO) | Admitting: Cardiology

## 2021-06-28 ENCOUNTER — Ambulatory Visit: Payer: Managed Care, Other (non HMO) | Admitting: Cardiology

## 2021-07-16 ENCOUNTER — Ambulatory Visit: Payer: Managed Care, Other (non HMO) | Admitting: Cardiology
